# Patient Record
Sex: Female | Born: 1950 | Race: White | Hispanic: No | Marital: Married | Smoking: Never smoker
Health system: Southern US, Community
[De-identification: ages and names within clinical notes are randomized; demographics above are authoritative.]

## PROBLEM LIST (undated history)

## (undated) DIAGNOSIS — E785 Hyperlipidemia, unspecified: Secondary | ICD-10-CM

## (undated) DIAGNOSIS — Z8619 Personal history of other infectious and parasitic diseases: Secondary | ICD-10-CM

## (undated) HISTORY — DX: Hyperlipidemia, unspecified: E78.5

## (undated) HISTORY — DX: Personal history of other infectious and parasitic diseases: Z86.19

---

## 2017-06-09 ENCOUNTER — Encounter: Payer: Self-pay | Admitting: Sports Medicine

## 2017-06-09 ENCOUNTER — Ambulatory Visit: Payer: Self-pay | Admitting: Sports Medicine

## 2017-06-09 ENCOUNTER — Ambulatory Visit (INDEPENDENT_AMBULATORY_CARE_PROVIDER_SITE_OTHER): Payer: Medicare Other | Admitting: Sports Medicine

## 2017-06-09 VITALS — BP 100/66 | HR 76 | Ht 66.5 in | Wt 110.0 lb

## 2017-06-09 DIAGNOSIS — M25532 Pain in left wrist: Secondary | ICD-10-CM | POA: Diagnosis not present

## 2017-06-09 MED ORDER — DICLOFENAC SODIUM 2 % TD SOLN: 1.0000 "application " | Freq: Two times a day (BID) | TRANSDERMAL | 0 refills | Status: AC

## 2017-06-09 NOTE — Progress Notes (Signed)
Heather Ponce. Heather [facility]once [facility] at [facility] [phone]  Tsering Leaman - 67 y.o. female MRN [id number]  Date of birth: [birth year]  Visit Date: 06/09/2017  PCP: Bing Matter, MD   Referred by: No ref. provider found   Scribe for today's visit: Christoper Fabian, ATC     SUBJECTIVE:  Lashaya Kienitz is here for New Patient (Initial Visit) (L wrist pain) .   Her L wrist pain symptoms INITIALLY: Began yesterday 06/08/17 at 3 pm when she slipped and fell on a wet, hardwood floor. Described as 6//10 sharp pain at it's worst but 0/10 at rest and nonradiating Worsened with lifting and general movement Improved with Aleve and ice Additional associated symptoms include: no radiating pain or N/T into the L arm/hand    At this time symptoms are improving compared to onset decreased pain noted She has been taking Aleve and using ice   ROS Denies night time disturbances. Denies fevers, chills, or night sweats. Denies unexplained weight loss. Denies personal history of cancer. Denies changes in bowel or bladder habits. Reports recent unreported falls. Denies new or worsening dyspnea or wheezing. Denies headaches or dizziness.  Denies numbness, tingling or weakness  In the extremities.  Denies dizziness or presyncopal episodes Denies lower extremity edema     HISTORY & PERTINENT PRIOR DATA:  Prior History reviewed and updated per electronic medical record.  Significant history, findings, studies and interim changes include:  reports that  has never smoked. she has never used smokeless tobacco. No results for input(s): HGBA1C, LABURIC, CREATINE in the last 8760 hours. No specialty comments available. No problems updated.  OBJECTIVE:  VS:  HT:5' 6.5" (168.9 cm)   WT:110 lb (49.9 kg)  BMI:17.49    BP:100/66  HR:76bpm  TEMP: ( )  RESP:97 %   PHYSICAL EXAM: Constitutional: WDWN, Non-toxic appearing. Psychiatric: Alert & appropriately  interactive.  Not depressed or anxious appearing. Respiratory: No increased work of breathing.  Trachea Midline Eyes: Pupils are equal.  EOM intact without nystagmus.  No scleral icterus  EXTREMITIES EXAM: No clubbing or cyanosis appreciated Capillary Refill is normal, less than 2 seconds No significant venous stasis changes Pre-tibial edema: No significant pretibial edema Pedal Pulses: Normal & symmetrically palpable  Sensation: Intact to light touch in all UE dermatomes Strength: Normal in bilateral UE myotomes   Left wrist overall well aligned.  No significant deformity.  She has some pain with palpation of the [facility] joint but this is mild.  No pain with axial loading and minimal pain with circumduction.  Ligamentously she is stable does have some bossing over the [facility] joint.  No significant pain with Lourena Simmonds testing.  Grip strength is intact.  She has normal thumb opposition.  No additional findings.   ASSESSMENT & PLAN:   1. Arthralgia of left wrist    PLAN: Symptoms are consistent with [facility] arthritis.  She is significantly improved compared to when this initially occurred and has rapidly been improving since the onset of symptoms.  We will have her begin with topical anti-inflammatories and offered a thumb spica splint however this was declined.  She will follow-up if any lack of improvement or worsening symptoms.  If worsening symptoms further diagnostic evaluation with x-ray will be indicated at that time.  No problem-specific Assessment & Plan notes found for this encounter.   ++++++++++++++++++++++++++++++++++++++++++++ Orders & Meds: No orders of the defined types were placed in this encounter.   Meds ordered this  encounter  Medications  . Diclofenac Sodium (PENNSAID) 2 % SOLN    Sig: Place 1 application onto the skin 2 (two) times daily for 1 day.    Dispense:  8 g    Refill:  0    ++++++++++++++++++++++++++++++++++++++++++++ Follow-up: Return if symptoms worsen or  fail to improve.   Pertinent documentation may be included in additional procedure notes, imaging studies, problem based documentation and patient instructions. Please see these sections of the encounter for additional information regarding this visit. CMA/ATC served as Neurosurgeonscribe during this visit. History, Physical, and Plan performed by medical provider. Documentation and orders reviewed and attested to.      Andrena MewsMichael D Rigby, DO    [facility]

## 2017-07-09 ENCOUNTER — Telehealth: Payer: Medicare Other | Admitting: Nurse Practitioner

## 2017-07-09 DIAGNOSIS — Z20828 Contact with and (suspected) exposure to other viral communicable diseases: Secondary | ICD-10-CM

## 2017-07-09 MED ORDER — OSELTAMIVIR PHOSPHATE 75 MG PO CAPS: 75.0000 mg | ORAL_CAPSULE | Freq: Every day | ORAL | 0 refills | Status: DC

## 2017-07-09 NOTE — Progress Notes (Signed)

## 2017-07-28 ENCOUNTER — Ambulatory Visit: Payer: Medicare Other | Admitting: Family Medicine

## 2017-07-28 ENCOUNTER — Ambulatory Visit (INDEPENDENT_AMBULATORY_CARE_PROVIDER_SITE_OTHER): Payer: Medicare Other | Admitting: Physician Assistant

## 2017-07-28 ENCOUNTER — Encounter: Payer: Self-pay | Admitting: Physician Assistant

## 2017-07-28 VITALS — BP 110/72 | HR 92 | Temp 98.0°F | Ht 65.25 in | Wt 109.5 lb

## 2017-07-28 DIAGNOSIS — Z7689 Persons encountering health services in other specified circumstances: Secondary | ICD-10-CM | POA: Diagnosis not present

## 2017-07-28 DIAGNOSIS — J0100 Acute maxillary sinusitis, unspecified: Secondary | ICD-10-CM

## 2017-07-28 MED ORDER — AMOXICILLIN-POT CLAVULANATE 875-125 MG PO TABS: 1.0000 | ORAL_TABLET | Freq: Two times a day (BID) | ORAL | 0 refills | Status: DC

## 2017-07-28 NOTE — Patient Instructions (Signed)
It was great to see you!  Use medication as prescribed: augmentin  Push fluids and get plenty of rest. Please return if you are not improving as expected, or if you have high fevers (>101.5) or difficulty swallowing or worsening productive cough.  Call clinic with questions.  I hope you start feeling better soon!  

## 2017-07-28 NOTE — Progress Notes (Signed)
Heather Ponce is a 67 y.o. female here for a new problem and to establish care.  I acted as a Neurosurgeon for [employer], PA-C Corky Mull, LPN  History of Present Illness:   Chief Complaint  Patient presents with  . Establish Care    [facility] Medicare  . Sinus Problem    Acute Concerns: Sinusitus -- She has nasal congestion with yellow and brown drainage. Has a slight cough. No fever, chills. Nasal pain under her eyes. Today is day 4 of her symptoms. Up to date on vaccines. Gets sinus infections about twice a year, uses amoxicillin or augmentin to clear it up.  Has been using Mucinex and Flonase, Neti Pot. Does have throat pain. She is starting to lose her voice.   Health Maintenance: Immunizations -- up to date Weight -- Weight: 109 lb 8 oz (49.7 kg)   No flowsheet data found.  No flowsheet data found.   Other providers/specialists: Dentist -- Dr. Wardell Heath  Past Medical History:  Diagnosis Date  . History of chicken pox   . Hyperlipidemia      Social History   Socioeconomic History  . Marital status: Married    Spouse name: Not on file  . Number of children: Not on file  . Years of education: Not on file  . Highest education level: Not on file  Social Needs  . Financial resource strain: Not on file  . Food insecurity - worry: Not on file  . Food insecurity - inability: Not on file  . Transportation needs - medical: Not on file  . Transportation needs - non-medical: Not on file  Occupational History  . Not on file  Tobacco Use  . Smoking status: Never Smoker  . Smokeless tobacco: Never Used  Substance and Sexual Activity  . Alcohol use: No    Frequency: Never  . Drug use: No  . Sexual activity: Yes  Other Topics Concern  . 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 VAGINAL DELIVERY  03/17/1979    Family History  Problem Relation Age of Onset  . Heart disease Mother   . High Cholesterol Mother   . Heart disease Father   . Colon cancer Neg Hx   . Breast cancer Neg Hx     No Known Allergies   Current Medications:   Current Outpatient Medications:  .  rosuvastatin (CRESTOR) 5 MG tablet, Take 5 mg by mouth daily., Disp: , Rfl:  .  amoxicillin-clavulanate (AUGMENTIN) 875-125 MG tablet, Take 1 tablet by mouth 2 (two) times daily., Disp: 20 tablet, Rfl: 0   Review of Systems:   Review of Systems  Constitutional: Negative for chills and fever.  HENT: Positive for congestion, ear pain and sinus pain. Negative for ear discharge.        Started on Monday facial pain, nasal congestion yellow brown nasal discharge. Scratchy throat, laryngitis.  Respiratory: Positive for cough.        Dry hacking cough.  Neurological: Negative for dizziness and headaches.    Vitals:   Vitals:   07/28/17 1111  BP: 110/72  Pulse: 92  Temp: 98 F (36.7 C)  TempSrc: Oral  SpO2: 98%  Weight: 109 lb 8 oz (49.7 kg)  Height: 5' 5.25" (1.657 m)     Body mass index is 18.08 kg/m.  Physical Exam:   Physical Exam  Constitutional: She appears well-developed. She is cooperative.  Non-toxic appearance. She does not have a sickly appearance. She does not appear ill. No distress.  HENT:  Head: Normocephalic and atraumatic.  Right Ear: Tympanic membrane, external ear and ear canal normal. Tympanic membrane is not erythematous, not retracted and not bulging.  Left Ear: Tympanic membrane, external ear and ear canal normal. Tympanic membrane is not erythematous, not retracted and not bulging.  Nose: Mucosal edema and rhinorrhea present. Right sinus exhibits maxillary sinus tenderness. Right sinus exhibits no frontal sinus tenderness. Left sinus exhibits maxillary sinus tenderness. Left sinus exhibits no frontal sinus tenderness.   Mouth/Throat: Uvula is midline and mucous membranes are normal. Posterior oropharyngeal erythema present. No posterior oropharyngeal edema. Tonsils are 1+ on the right. Tonsils are 1+ on the left. No tonsillar exudate.  Eyes: Conjunctivae and lids are normal.  Neck: Trachea normal.  Cardiovascular: Normal rate, regular rhythm, S1 normal, S2 normal and normal heart sounds.  Pulmonary/Chest: Effort normal and breath sounds normal. She has no decreased breath sounds. She has no wheezes. She has no rhonchi. She has no rales.  Lymphadenopathy:    She has no cervical adenopathy.  Neurological: She is alert.  Skin: Skin is warm, dry and intact.  Psychiatric: She has a normal mood and affect. Her speech is normal and behavior is normal.  Nursing note and vitals reviewed.   Assessment and Plan:    Darl PikesSusan was seen today for establish care and sinus problem.  Diagnoses and all orders for this visit:  Acute maxillary sinusitis, recurrence not specified No red flags on exam.  Will initiate Augmentin per orders. Discussed taking medications as prescribed. Reviewed return precautions including worsening fever, SOB, worsening cough or other concerns. Push fluids and rest. I recommend that patient follow-up if symptoms worsen or persist despite treatment x 7-10 days, sooner if needed.  Encounter to establish care  Other orders -     amoxicillin-clavulanate (AUGMENTIN) 875-125 MG tablet; Take 1 tablet by mouth 2 (two) times daily.   . Reviewed expectations re: course of current medical issues. . Discussed self-management of symptoms. . Outlined signs and symptoms indicating need for more acute intervention. . Patient verbalized understanding and all questions were answered. . See orders for this visit as documented in the electronic medical record. . Patient received an After-Visit Summary.   CMA or LPN served as scribe during this visit. History, Physical, and Plan performed by medical provider.  Documentation and orders reviewed and attested to.   Jarold MottoSamantha Navika Hoopes, PA-C

## 2017-10-06 ENCOUNTER — Encounter: Payer: Self-pay | Admitting: Physician Assistant

## 2017-10-06 ENCOUNTER — Ambulatory Visit: Payer: Medicare Other | Admitting: Physician Assistant

## 2017-10-06 VITALS — BP 106/64 | HR 99 | Temp 97.8°F | Ht 65.25 in | Wt 105.0 lb

## 2017-10-06 DIAGNOSIS — J0101 Acute recurrent maxillary sinusitis: Secondary | ICD-10-CM

## 2017-10-06 MED ORDER — AMOXICILLIN-POT CLAVULANATE 875-125 MG PO TABS: 1.0000 | ORAL_TABLET | Freq: Two times a day (BID) | ORAL | 0 refills | Status: DC

## 2017-10-06 NOTE — Progress Notes (Signed)
Heather Ponce is a 67 y.o. female here for a new problem.  I acted as a Neurosurgeon for [employer], PA-C Corky Mull, LPN  History of Present Illness:   Chief Complaint  Patient presents with  . Sinus Problem    Sinus Problem  This is a new problem. Episode onset: Started on Monday. The problem has been gradually worsening since onset. There has been no fever. Her pain is at a severity of 10/10. The pain is severe. Associated symptoms include congestion (Nasal, yellow drainage), headaches, a hoarse voice, sinus pressure and a sore throat (from post nasal drip). Pertinent negatives include no chills or ear pain. Past treatments include spray decongestants (Mucinex and Zyrtec). The treatment provided no relief.    Past Medical History:  Diagnosis Date  . History of chicken pox   . Hyperlipidemia      Social History   Socioeconomic History  . Marital status: Married    Spouse name: Not on file  . Number of children: Not on file  . Years of education: Not on file  . Highest education level: Not on file  Occupational History  . Not on file  Social Needs  . Financial resource strain: Not on file  . Food insecurity:    Worry: Not on file    Inability: Not on file  . Transportation needs:    Medical: Not on file    Non-medical: Not on file  Tobacco Use  . Smoking status: Never Smoker  . Smokeless tobacco: Never Used  Substance and Sexual Activity  . Alcohol use: No    Frequency: Never  . Drug use: No  . Sexual activity: Yes  Lifestyle  . Physical activity:    Days per week: Not on file    Minutes per session: Not on file  . Stress: Not on file  Relationships  . Social connections:    Talks on phone: Not on file    Gets together: Not on file    Attends religious service: Not on file    Active member of club or organization: Not on file    Attends meetings of clubs or organizations: Not on file    Relationship status: Not on file  . Intimate partner violence:   Fear of current or ex partner: Not on file    Emotionally abused: Not on file    Physically abused: Not on file    Forced sexual activity: Not on file  Other Topics Concern  . 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 VAGINAL DELIVERY  03/17/1979    Family History  Problem Relation Age of Onset  . Heart disease Mother   . High Cholesterol Mother   . Heart disease Father   . Colon cancer Neg Hx   . Breast cancer Neg Hx     No Known Allergies  Current Medications:   Current Outpatient Medications:  .  amoxicillin-clavulanate (AUGMENTIN) 875-125 MG tablet, Take 1 tablet by mouth 2 (two) times daily., Disp: 20 tablet, Rfl: 0 .  fluticasone (FLONASE) 50 MCG/ACT nasal spray, fluticasone propionate 50 mcg/actuation nasal spray,suspension, Disp: , Rfl:  .  rosuvastatin (CRESTOR) 5 MG tablet, Take 5 mg by mouth daily., Disp: , Rfl:  Review of Systems:   Review of Systems  Constitutional: Negative for chills.  HENT: Positive for congestion (Nasal, yellow drainage), hoarse voice, sinus pressure and sore throat (from post nasal drip). Negative for ear pain.   Neurological: Positive for headaches.    Vitals:   Vitals:   10/06/17 0930  BP: 106/64  Pulse: 99  Temp: 97.8 F (36.6 C)  TempSrc: Oral  SpO2: 97%  Weight: 105 lb (47.6 kg)  Height: 5' 5.25" (1.657 m)     Body mass index is 17.34 kg/m.  Physical Exam:   Physical Exam  Constitutional: She appears well-developed. She is cooperative.  Non-toxic appearance. She does not have a sickly appearance. She does not appear ill. No distress.  HENT:  Head: Normocephalic and atraumatic.  Right Ear: Tympanic membrane, external ear and ear canal normal. Tympanic membrane is not erythematous, not retracted and  not bulging.  Left Ear: Tympanic membrane, external ear and ear canal normal. Tympanic membrane is not erythematous, not retracted and not bulging.  Nose: Mucosal edema and rhinorrhea present. Right sinus exhibits maxillary sinus tenderness. Right sinus exhibits no frontal sinus tenderness. Left sinus exhibits maxillary sinus tenderness. Left sinus exhibits no frontal sinus tenderness.  Mouth/Throat: Uvula is midline and mucous membranes are normal. Posterior oropharyngeal erythema present. No posterior oropharyngeal edema. Tonsils are 0 on the right. Tonsils are 0 on the left.  Eyes: Conjunctivae and lids are normal.  Neck: Trachea normal.  Cardiovascular: Normal rate, regular rhythm, S1 normal, S2 normal and normal heart sounds.  Pulmonary/Chest: Effort normal and breath sounds normal. She has no decreased breath sounds. She has no wheezes. She has no rhonchi. She has no rales.  Lymphadenopathy:    She has no cervical adenopathy.  Neurological: She is alert.  Skin: Skin is warm, dry and intact.  Psychiatric: She has a normal mood and affect. Her speech is normal and behavior is normal.  Nursing note and vitals reviewed.   Assessment and Plan:    [name] was seen today for sinus problem.  Diagnoses and all orders for this visit:  Acute recurrent maxillary sinusitis  Other orders -     amoxicillin-clavulanate (AUGMENTIN) 875-125 MG tablet; Take 1 tablet by mouth 2 (two) times daily.   No red flags on exam.  Will initiate Augmentin per orders. Discussed taking medications as prescribed. Reviewed return precautions including worsening fever, SOB, worsening cough or other concerns. Push fluids and rest. I recommend that patient follow-up if symptoms worsen or persist despite treatment x 7-10 days, sooner if needed.   . Reviewed expectations re: course of current medical issues. . Discussed self-management of symptoms. . Outlined signs and symptoms indicating need for more acute  intervention. . Patient verbalized understanding and all questions were answered. . See orders for this visit as documented in the electronic medical record. . Patient received an After-Visit Summary.  CMA or LPN served as scribe during this visit. History, Physical, and Plan performed by medical provider. Documentation and orders reviewed and attested to.  Jarold Motto, PA-C

## 2017-10-06 NOTE — Patient Instructions (Signed)
It was great to see you!  Use medication as prescribed: Augmentin Push fluids and get plenty of rest. Please return if you are not improving as expected, or if you have high fevers (>101.5) or difficulty swallowing or worsening productive cough.  Call clinic with questions.  I hope you start feeling better soon!  

## 2018-09-27 ENCOUNTER — Ambulatory Visit: Payer: Medicare Other | Admitting: Physician Assistant

## 2019-04-04 ENCOUNTER — Other Ambulatory Visit: Payer: Self-pay | Admitting: Nurse Practitioner

## 2019-04-04 ENCOUNTER — Other Ambulatory Visit: Payer: Self-pay | Admitting: Neurology

## 2019-04-04 DIAGNOSIS — N644 Mastodynia: Secondary | ICD-10-CM

## 2019-04-06 ENCOUNTER — Ambulatory Visit
Admission: RE | Admit: 2019-04-06 | Discharge: 2019-04-06 | Disposition: A | Discharge status: Home / Self Care | Payer: Medicare Other | Source: Ambulatory Visit | Attending: Nurse Practitioner | Admitting: Nurse Practitioner

## 2019-04-06 ENCOUNTER — Other Ambulatory Visit: Payer: Self-pay

## 2019-04-06 DIAGNOSIS — N644 Mastodynia: Secondary | ICD-10-CM

## 2019-04-06 IMAGING — MR MR BREAST BILAT WO/W CM
8 of 12 series · 33 of 48 positions shown · IV contrast (4 ML GADAVIST)
Comparison: Previous exam(s).

CLINICAL DATA: 68-year-old female complaining of flattening of the
left nipple and burning sensation within the left breast. Patient
had a recent outside diagnostic evaluation in [DATE] which was
unremarkable.

LABS:  Creatinine was obtained on site at [HOSPITAL] at [REDACTED] [HOSPITAL].
Results: Creatinine 0.8 mg/dL.
EXAM:
BILATERAL BREAST MRI WITH AND WITHOUT CONTRAST
TECHNIQUE: Multiplanar, multisequence MR images of both breasts were obtained
prior to and following the intravenous administration of 4 ml of
Gadavist.

[Series 2: t2_tirm_tra ipat (a-p) · axial · 3.0mm · 0.64mm/px · 1 of 55 slices shown]
[im 1/55]
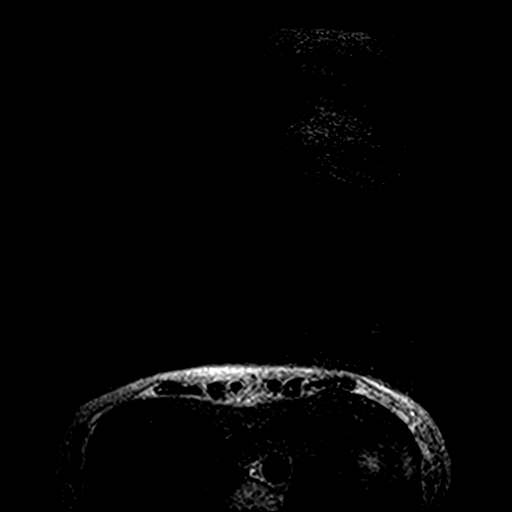

[Series 3: fl3d pre-cm no · axial · non-contrast · 1.2mm · 0.86mm/px · z∈[-83,+88]mm · 5 of 144 slices shown]
[im 1/144]
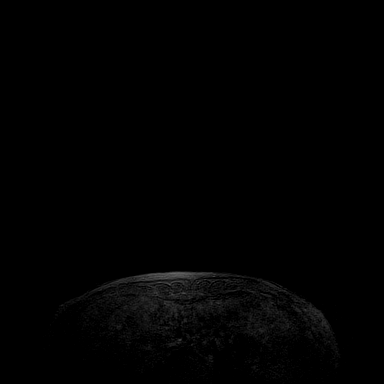
[im 36/144]
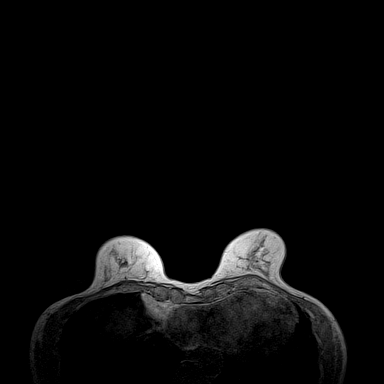
[im 72/144]
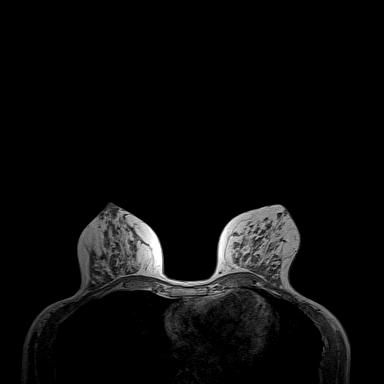
[im 108/144]
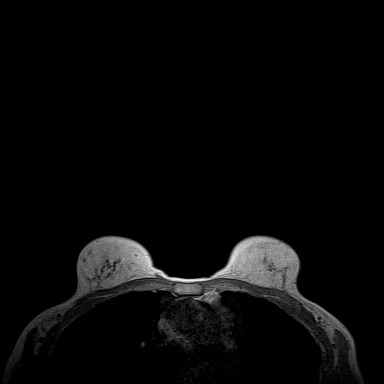
[im 144/144]
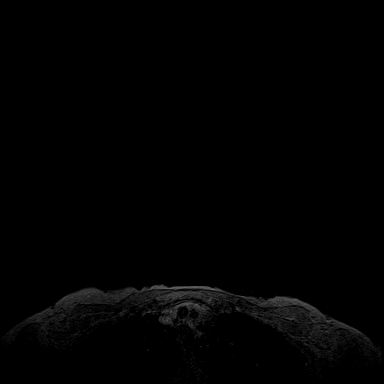

[Series 4: fl3d pre-cm · axial · non-contrast · 1.2mm · 0.86mm/px · z∈[-83,+88]mm · 5 of 144 slices shown]
[im 1/144]
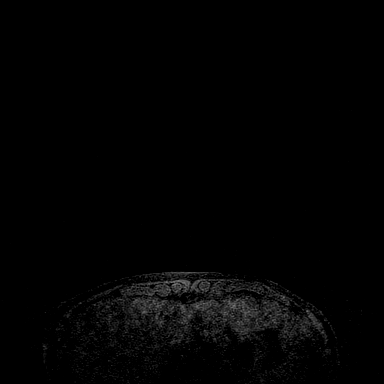
[im 36/144]
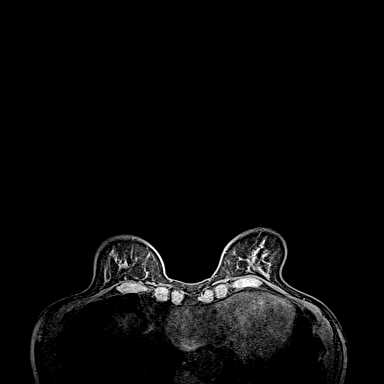
[im 72/144]
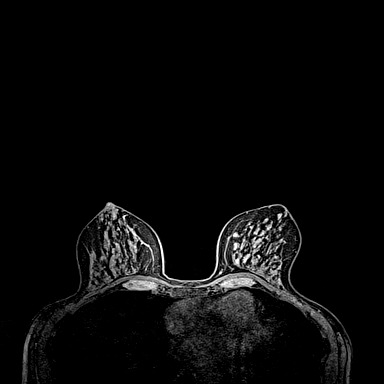
[im 108/144]
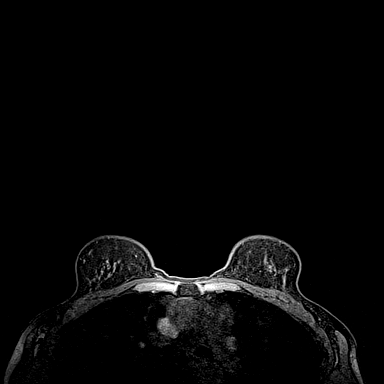
[im 144/144]
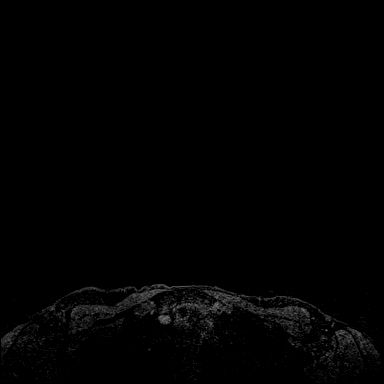

[Series 5: fl3d post-cm 20 · axial · 1.2mm · 0.86mm/px · z∈[-83,+88]mm · 5 of 144 slices shown (1 of 3)]
[im 1/144]
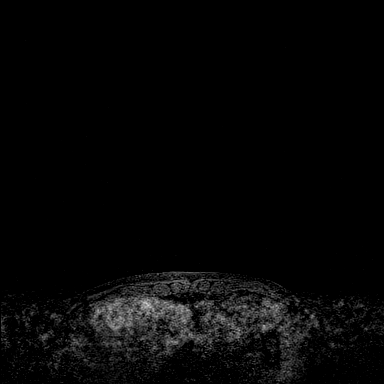
[im 36/144]
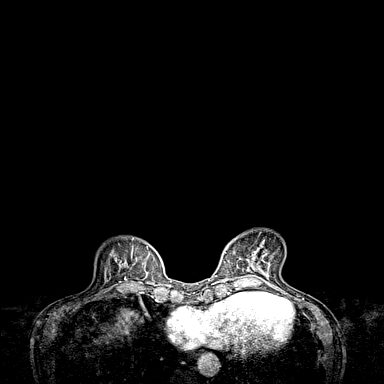
[im 72/144]
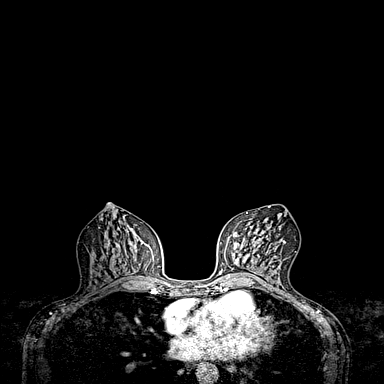
[im 108/144]
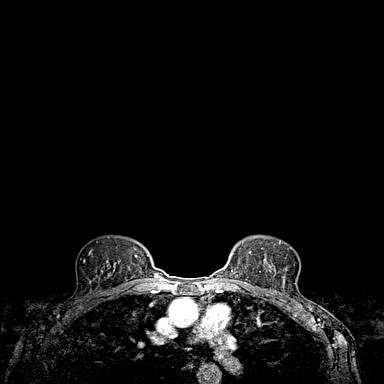
[im 144/144]
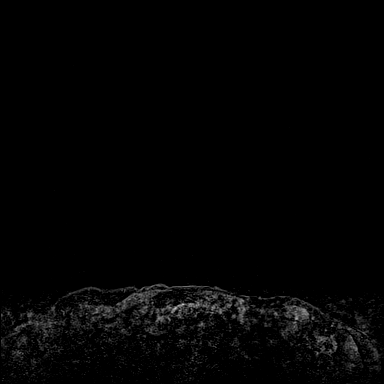

[Series 6: fl3d post-cm 20 · axial · 1.2mm · 0.86mm/px · z∈[-83,+88]mm · 5 of 144 slices shown (2 of 3)]
[im 1/144]
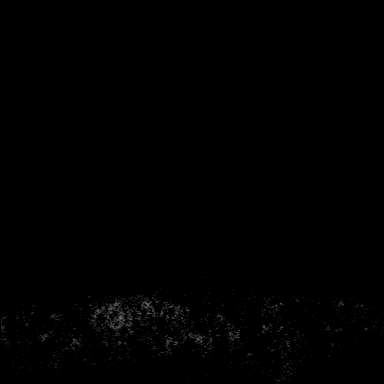
[im 36/144]
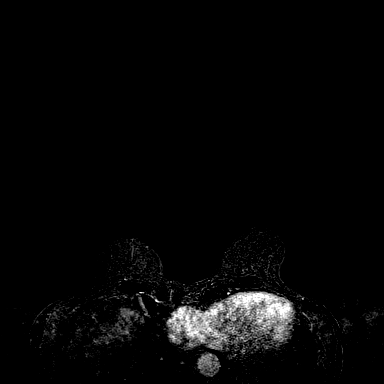
[im 72/144]
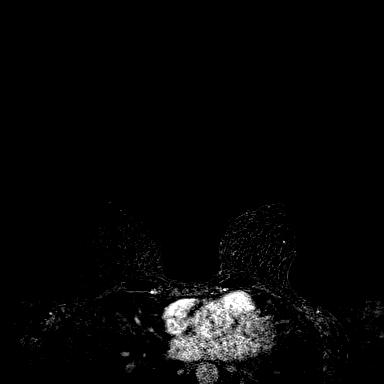
[im 108/144]
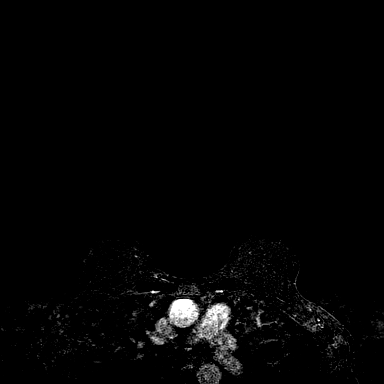
[im 144/144]
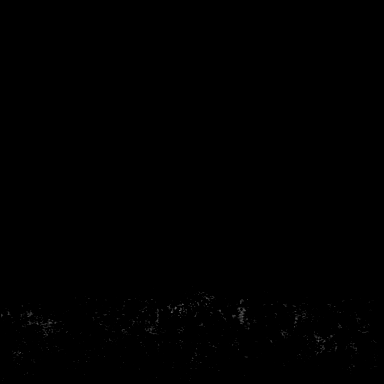

[Series 7: fl3d post-cm 20 · axial · 172.8mm · 0.86mm/px · 1 of 1 slices shown (3 of 3)]
[im 1/1]
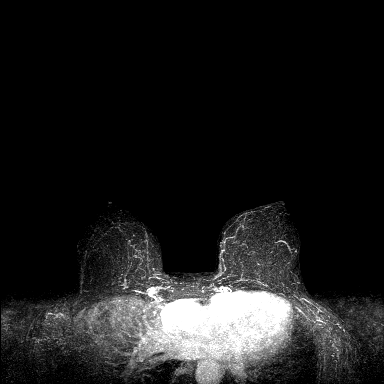

[Series 8: fl3d post-cm 3min · axial · 1.2mm · 0.86mm/px · z∈[-83,+88]mm · 6 of 144 slices shown]
[im 1/144]
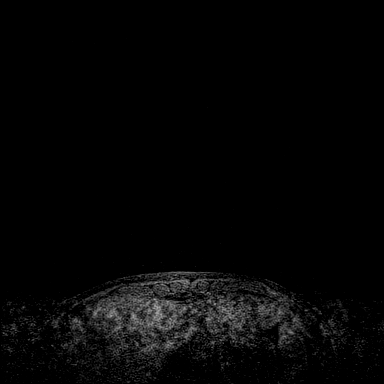
[im 29/144]
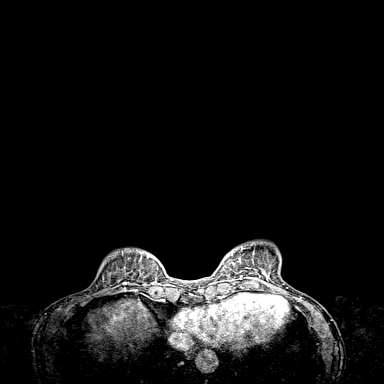
[im 58/144]
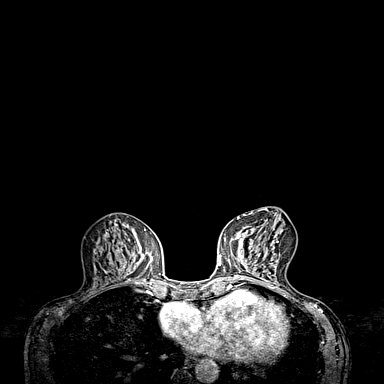
[im 86/144]
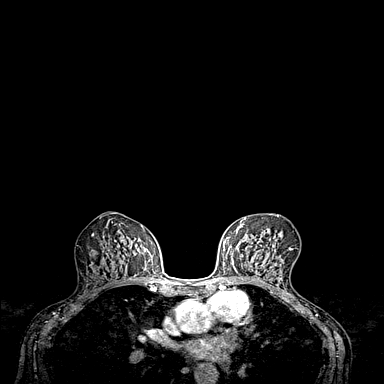
[im 115/144]
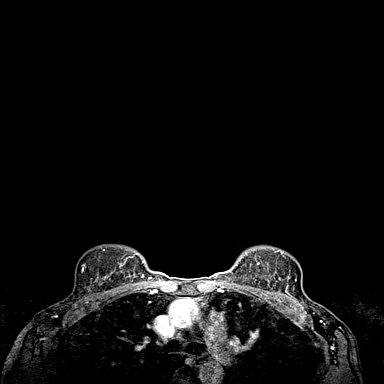
[im 144/144]
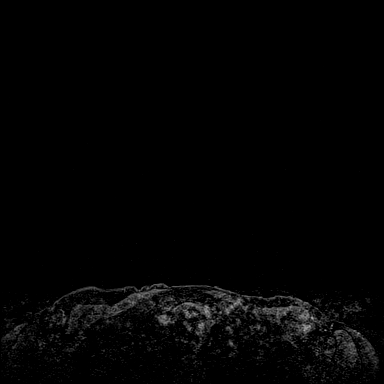

[Series 9: fl3d post-cm 3min_sub · axial · 1.2mm · 0.86mm/px · z∈[-83,+54]mm · 5 of 144 slices shown]
[im 1/144]
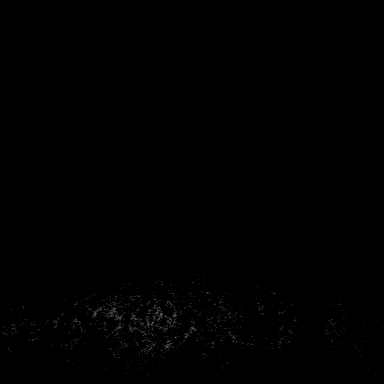
[im 29/144]
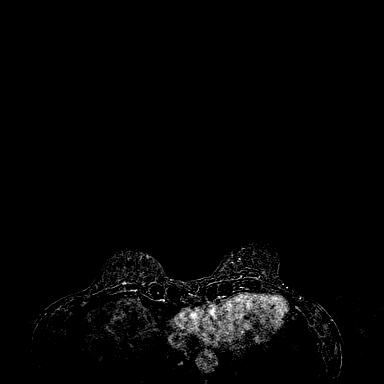
[im 58/144]
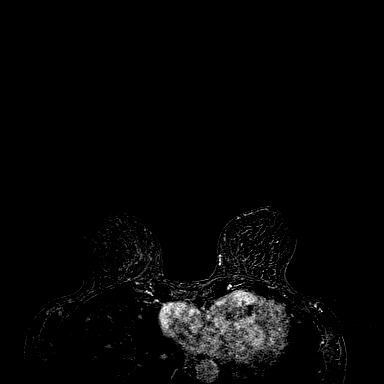
[im 86/144]
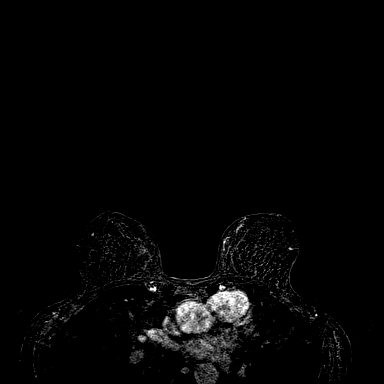
[im 115/144]
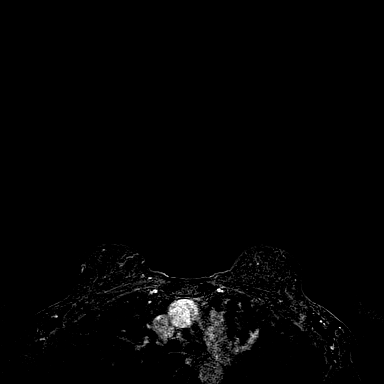

[33 of 48 positions shown; findings below may reference images not displayed]

Three-dimensional MR images were rendered by post-processing of the
original MR data on an independent workstation. The
three-dimensional MR images were interpreted, and findings are
reported in the following complete MRI report for this study. Three
dimensional images were evaluated at the independent DynaCad
workstation
FINDINGS: Breast composition: c. Heterogeneous fibroglandular tissue.

Background parenchymal enhancement: Mild.

Right breast: No mass or abnormal enhancement.

Left breast: No mass or abnormal enhancement.

Lymph nodes: No abnormal appearing lymph nodes.

Ancillary findings:  None.
IMPRESSION: No MRI evidence for malignancy in either breast.

RECOMMENDATION:
Clinical and symptomatic follow-up for the patient's left breast
symptoms.

Patient is due for annual bilateral screening in [DATE].

BI-RADS CATEGORY  1: Negative.

## 2019-04-06 MED ORDER — GADOBUTROL 1 MMOL/ML IV SOLN
4.0000 mL | Freq: Once | INTRAVENOUS | Status: AC | PRN
  Administered 2019-04-06: 4 mL via INTRAVENOUS

## 2019-07-09 ENCOUNTER — Ambulatory Visit: Payer: Medicare Other

## 2019-07-15 ENCOUNTER — Ambulatory Visit: Payer: Medicare Other

## 2020-01-11 ENCOUNTER — Other Ambulatory Visit: Payer: Self-pay | Admitting: Medical

## 2020-01-11 DIAGNOSIS — N644 Mastodynia: Secondary | ICD-10-CM

## 2020-03-25 ENCOUNTER — Ambulatory Visit: Payer: Self-pay

## 2020-10-17 ENCOUNTER — Other Ambulatory Visit: Payer: Self-pay | Admitting: Medical

## 2020-10-17 DIAGNOSIS — N644 Mastodynia: Secondary | ICD-10-CM

## 2020-10-29 ENCOUNTER — Other Ambulatory Visit: Payer: Medicare Other

## 2020-11-20 ENCOUNTER — Other Ambulatory Visit: Payer: Self-pay

## 2020-11-20 ENCOUNTER — Ambulatory Visit
Admission: RE | Admit: 2020-11-20 | Discharge: 2020-11-20 | Disposition: A | Discharge status: Home / Self Care | Payer: Medicare PPO | Source: Ambulatory Visit | Attending: Family Medicine | Admitting: Family Medicine

## 2020-11-20 DIAGNOSIS — N644 Mastodynia: Secondary | ICD-10-CM

## 2020-11-20 IMAGING — MR MR BREAST BILAT WO/W CM
8 of 12 series · 33 of 48 positions shown · IV contrast (5 mL GADAVIST)
Comparison: Previous exam(s).

CLINICAL DATA: 69-year-old female presenting for evaluation of
left-sided breast pain [REDACTED], and states that she pulled muscle
within pushing her grandchild on a swing. She has history of a
benign left breast biopsy twenty years ago.

LABS:  None.
EXAM:
BILATERAL BREAST MRI WITH AND WITHOUT CONTRAST
TECHNIQUE: Multiplanar, multisequence MR images of both breasts were obtained
prior to and following the intravenous administration of 5 ml of
Gadavist

[Series 2: t2_tirm_tra ipat (a-p) · axial · 3.0mm · 0.70mm/px · 1 of 55 slices shown]
[im 1/55]
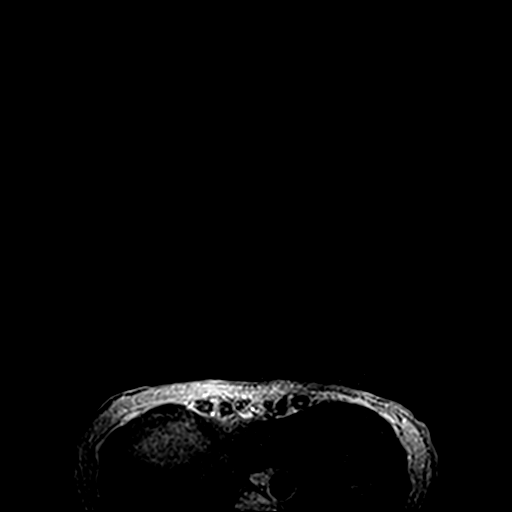

[Series 3: fl3d pre-cm no · axial · non-contrast · 1.2mm · 0.94mm/px · z∈[-90,+82]mm · 5 of 144 slices shown]
[im 1/144]
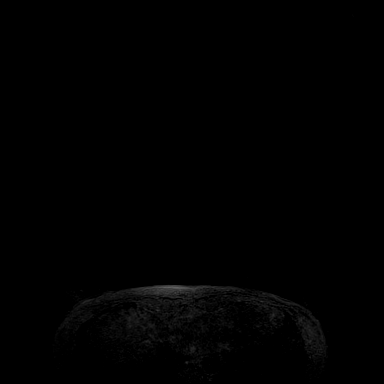
[im 36/144]
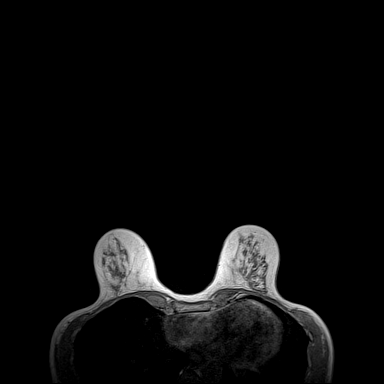
[im 72/144]
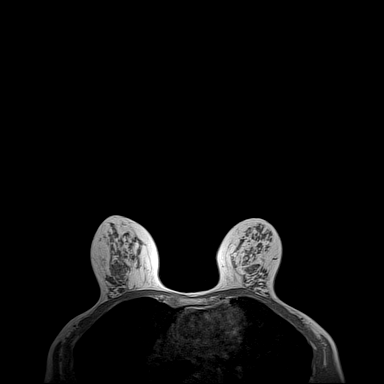
[im 108/144]
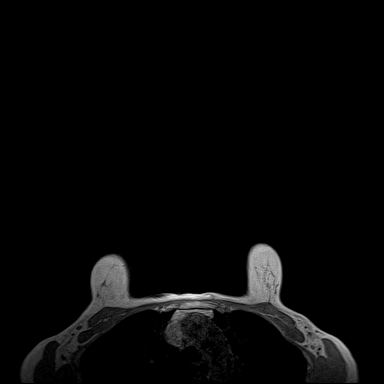
[im 144/144]
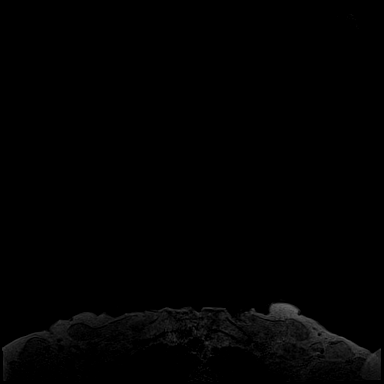

[Series 4: fl3d pre-cm · axial · non-contrast · 1.2mm · 0.94mm/px · z∈[-90,+82]mm · 5 of 144 slices shown]
[im 1/144]
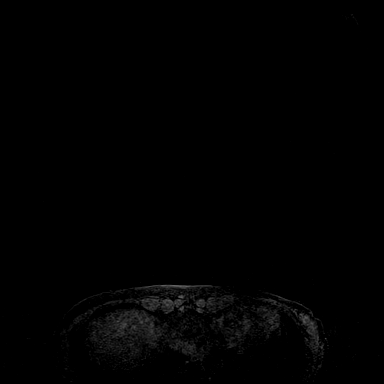
[im 36/144]
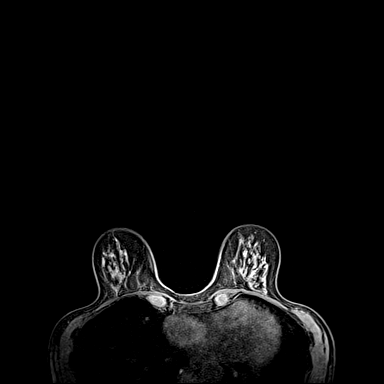
[im 72/144]
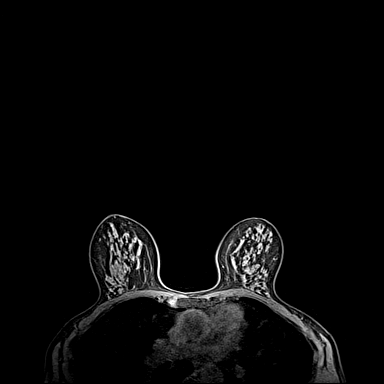
[im 108/144]
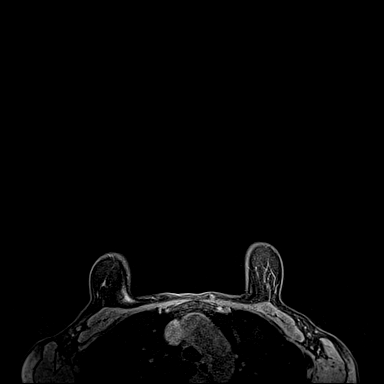
[im 144/144]
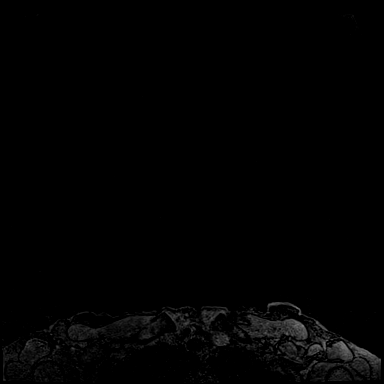

[Series 5: fl3d post immediate · axial · 1.2mm · 0.94mm/px · z∈[-90,+82]mm · 5 of 144 slices shown (1 of 3)]
[im 1/144]
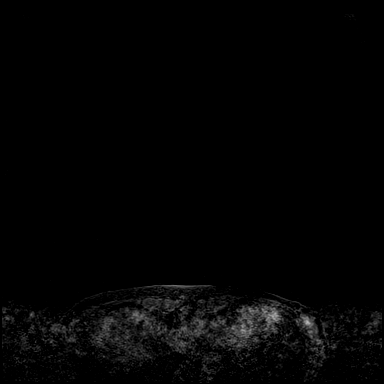
[im 36/144]
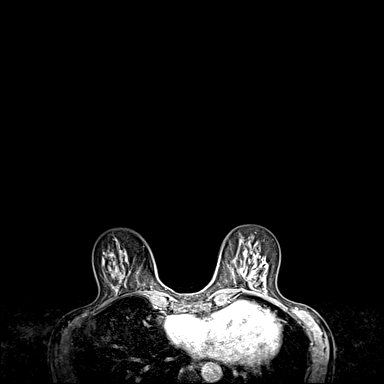
[im 72/144]
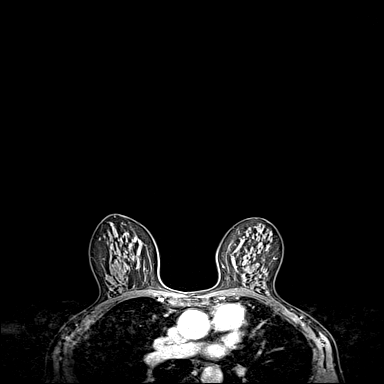
[im 108/144]
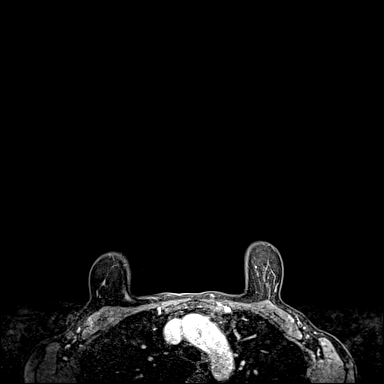
[im 144/144]
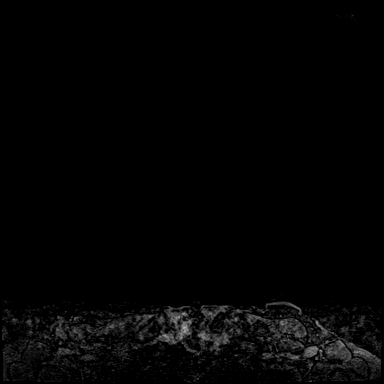

[Series 6: fl3d post immediate · axial · 1.2mm · 0.94mm/px · z∈[-90,+82]mm · 5 of 144 slices shown (2 of 3)]
[im 1/144]
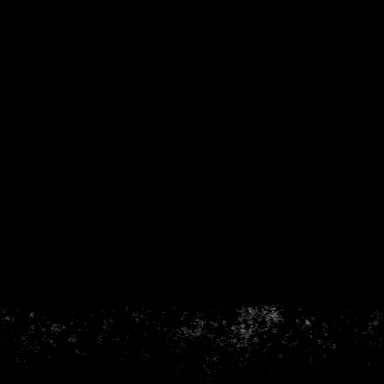
[im 36/144]
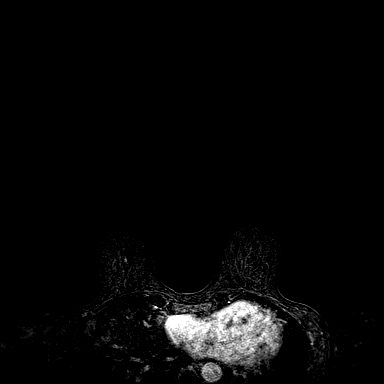
[im 72/144]
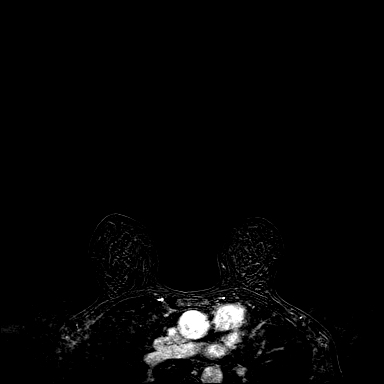
[im 108/144]
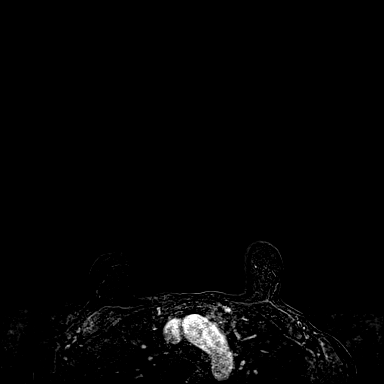
[im 144/144]
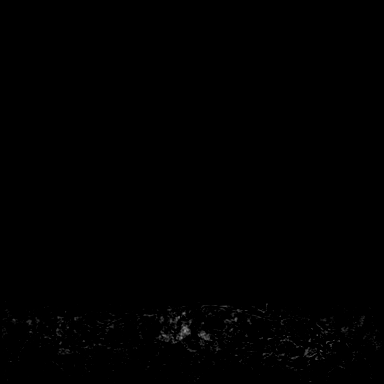

[Series 7: fl3d post immediate · axial · 172.8mm · 0.94mm/px · 1 of 1 slices shown (3 of 3)]
[im 1/1]
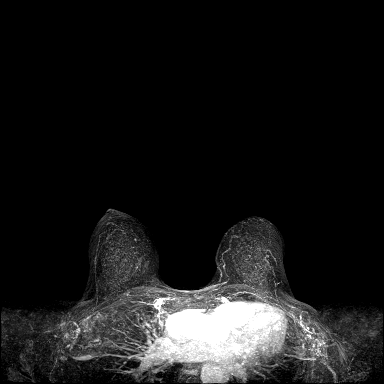

[Series 8: fl3d post 3min · axial · 1.2mm · 0.94mm/px · z∈[-90,+82]mm · 6 of 144 slices shown]
[im 1/144]
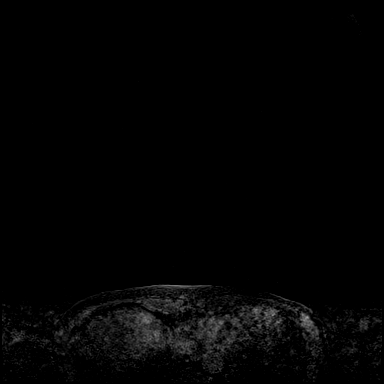
[im 29/144]
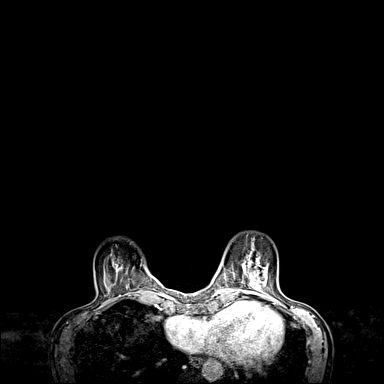
[im 58/144]
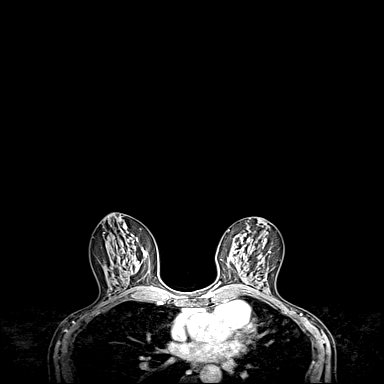
[im 86/144]
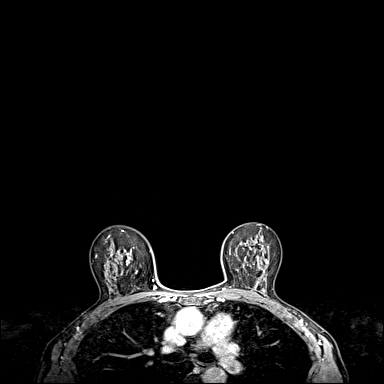
[im 115/144]
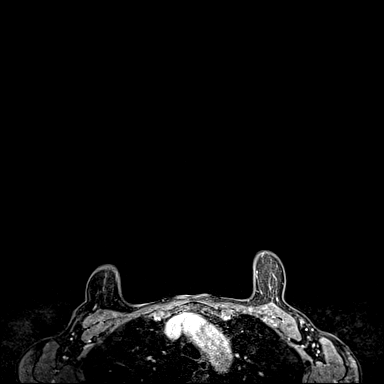
[im 144/144]
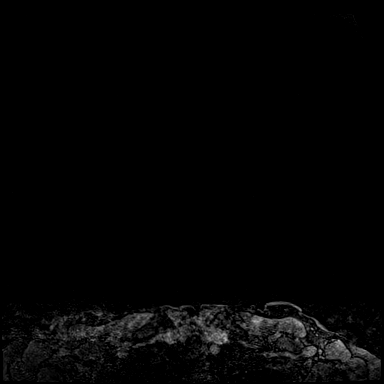

[Series 9: fl3d post 3min_sub · axial · 1.2mm · 0.94mm/px · z∈[-90,+47]mm · 5 of 144 slices shown]
[im 1/144]
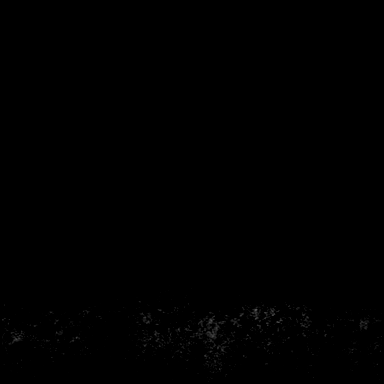
[im 29/144]
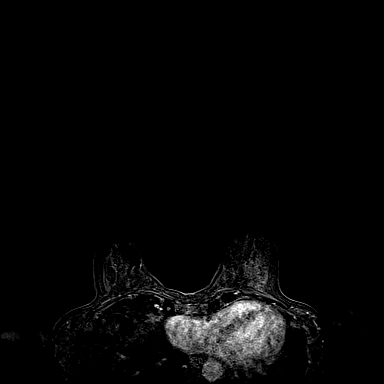
[im 58/144]
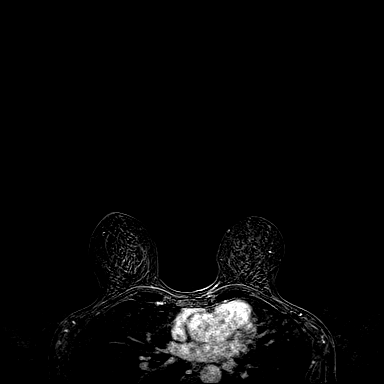
[im 86/144]
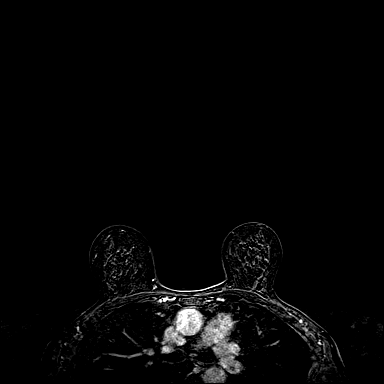
[im 115/144]
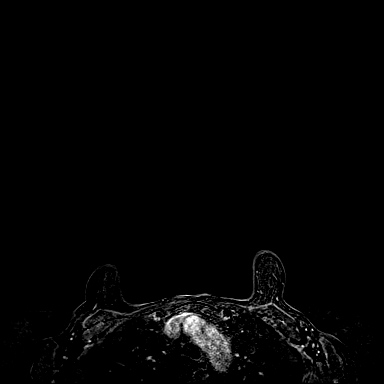

[33 of 48 positions shown; findings below may reference images not displayed]

Three-dimensional MR images were rendered by post-processing of the
original MR data on an independent workstation. The
three-dimensional MR images were interpreted, and findings are
reported in the following complete MRI report for this study. Three
dimensional images were evaluated at the independent interpreting
workstation using the DynaCAD thin client.
FINDINGS: Breast composition: c. Heterogeneous fibroglandular tissue.

Background parenchymal enhancement: Minimal.

Right breast: No mass or abnormal enhancement.

Left breast: No mass or abnormal enhancement.

Lymph nodes: No abnormal appearing lymph nodes.

Ancillary findings:  None.
IMPRESSION: No MRI evidence of malignancy in the bilateral breasts. No findings
to correspond with the patient's left-sided breast pain.

RECOMMENDATION:
1. Clinical follow-up recommended for the left breast pain. Any
further workup should be based on clinical grounds.

2.  Annual screening mammography due in [DATE].

BI-RADS CATEGORY  1: Negative.

## 2020-11-20 MED ORDER — GADOBUTROL 1 MMOL/ML IV SOLN
5.0000 mL | Freq: Once | INTRAVENOUS | Status: AC | PRN
  Administered 2020-11-20: 11:00:00 5 mL via INTRAVENOUS

## 2021-04-19 ENCOUNTER — Telehealth: Payer: Medicare PPO | Admitting: Physician Assistant

## 2021-04-19 DIAGNOSIS — Z20828 Contact with and (suspected) exposure to other viral communicable diseases: Secondary | ICD-10-CM | POA: Diagnosis not present

## 2021-04-19 MED ORDER — OSELTAMIVIR PHOSPHATE 75 MG PO CAPS: 75.0000 mg | ORAL_CAPSULE | Freq: Every day | ORAL | 0 refills | Status: DC

## 2021-04-19 NOTE — Progress Notes (Signed)
E visit for Flu like symptoms   We are sorry that you are not feeling well.  Here is how we plan to help! Based on what you have shared with me it looks like you may have had a flu exposure.  Influenza or "the flu" is   an infection caused by a respiratory virus. The flu virus is highly contagious and persons who did not receive their yearly flu vaccination may "catch" the flu from close contact.  We have anti-viral medications to treat the viruses that cause this infection. They are not a "cure" and only shorten the course of the infection. These prescriptions are most effective when they are given within the first 2 days of "flu" symptoms. Antiviral medication are indicated if you have a high risk of complications from the flu. You should  also consider an antiviral medication if you are in close contact with someone who is at risk. These medications can help patients avoid complications from the flu  but have side effects that you should know. Possible side effects from Tamiflu or oseltamivir include nausea, vomiting, diarrhea, dizziness, headaches, eye redness, sleep problems or other respiratory symptoms. You should not take Tamiflu if you have an allergy to oseltamivir or any to the ingredients in Tamiflu.  Based upon your symptoms and potential risk factors Tamiflu 75mg once daily for 10 days as prophylaxis. If you develop symptoms, start taking twice daily.   ANYONE WHO HAS FLU SYMPTOMS SHOULD: Stay home. The flu is highly contagious and going out or to work exposes others! Be sure to drink plenty of fluids. Water is fine as well as fruit juices, sodas and electrolyte beverages. You may want to stay away from caffeine or alcohol. If you are nauseated, try taking small sips of liquids. How do you know if you are getting enough fluid? Your urine should be a pale yellow or almost colorless. Get rest. Taking a steamy shower or using a humidifier may help nasal congestion and ease sore throat  pain. Using a saline nasal spray works much the same way. Cough drops, hard candies and sore throat lozenges may ease your cough. Line up a caregiver. Have someone check on you regularly.   GET HELP RIGHT AWAY IF: You cannot keep down liquids or your medications. You become short of breath Your fell like you are going to pass out or loose consciousness. Your symptoms persist after you have completed your treatment plan MAKE SURE YOU  Understand these instructions. Will watch your condition. Will get help right away if you are not doing well or get worse.  Your e-visit answers were reviewed by a board certified advanced clinical practitioner to complete your personal care plan.  Depending on the condition, your plan could have included both over the counter or prescription medications.  If there is a problem please reply  once you have received a response from your provider.  Your safety is important to us.  If you have drug allergies check your prescription carefully.    You can use MyChart to ask questions about today's visit, request a non-urgent call back, or ask for a work or school excuse for 24 hours related to this e-Visit. If it has been greater than 24 hours you will need to follow up with your provider, or enter a new e-Visit to address those concerns.  You will get an e-mail in the next two days asking about your experience.  I hope that your e-visit has been valuable and   will speed your recovery. Thank you for using e-visits.   I provided 5 minutes of non face-to-face time during this encounter for chart review and documentation.  

## 2021-09-21 ENCOUNTER — Other Ambulatory Visit (HOSPITAL_BASED_OUTPATIENT_CLINIC_OR_DEPARTMENT_OTHER): Payer: Self-pay

## 2021-09-21 ENCOUNTER — Telehealth: Payer: Medicare PPO | Admitting: Physician Assistant

## 2021-09-21 DIAGNOSIS — B9689 Other specified bacterial agents as the cause of diseases classified elsewhere: Secondary | ICD-10-CM

## 2021-09-21 DIAGNOSIS — J019 Acute sinusitis, unspecified: Secondary | ICD-10-CM

## 2021-09-21 MED ORDER — AMOXICILLIN-POT CLAVULANATE 875-125 MG PO TABS
1.0000 | ORAL_TABLET | Freq: Two times a day (BID) | ORAL | 0 refills | Status: DC
  Filled 2021-09-21: qty 20, 10d supply, fill #0

## 2021-09-21 NOTE — Progress Notes (Signed)

## 2022-03-15 ENCOUNTER — Telehealth: Payer: Medicare PPO | Admitting: Emergency Medicine

## 2022-03-15 ENCOUNTER — Other Ambulatory Visit (HOSPITAL_BASED_OUTPATIENT_CLINIC_OR_DEPARTMENT_OTHER): Payer: Self-pay

## 2022-03-15 DIAGNOSIS — J019 Acute sinusitis, unspecified: Secondary | ICD-10-CM

## 2022-03-15 MED ORDER — AMOXICILLIN-POT CLAVULANATE 875-125 MG PO TABS
1.0000 | ORAL_TABLET | Freq: Two times a day (BID) | ORAL | 0 refills | Status: DC
  Filled 2022-03-15: qty 14, 7d supply, fill #0

## 2022-03-15 NOTE — Progress Notes (Signed)

## 2022-09-06 ENCOUNTER — Telehealth: Payer: Medicare PPO | Admitting: Family Medicine

## 2022-09-06 DIAGNOSIS — J019 Acute sinusitis, unspecified: Secondary | ICD-10-CM

## 2022-09-06 MED ORDER — AMOXICILLIN-POT CLAVULANATE 875-125 MG PO TABS: 1.0000 | ORAL_TABLET | Freq: Two times a day (BID) | ORAL | 0 refills | Status: AC

## 2022-09-06 NOTE — Progress Notes (Signed)

## 2023-01-12 ENCOUNTER — Encounter: Payer: Self-pay | Admitting: Medical

## 2023-01-12 ENCOUNTER — Other Ambulatory Visit: Payer: Self-pay | Admitting: Medical

## 2023-01-12 DIAGNOSIS — N644 Mastodynia: Secondary | ICD-10-CM

## 2023-02-18 ENCOUNTER — Encounter: Payer: Self-pay | Admitting: Medical

## 2023-02-20 ENCOUNTER — Ambulatory Visit
Admission: RE | Admit: 2023-02-20 | Discharge: 2023-02-20 | Disposition: A | Discharge status: Home / Self Care | Payer: Medicare PPO | Source: Ambulatory Visit

## 2023-02-20 DIAGNOSIS — N644 Mastodynia: Secondary | ICD-10-CM

## 2023-02-20 MED ORDER — GADOPICLENOL 0.5 MMOL/ML IV SOLN
5.0000 mL | Freq: Once | INTRAVENOUS | Status: AC | PRN
  Administered 2023-02-20: 5 mL via INTRAVENOUS

## 2023-05-26 ENCOUNTER — Encounter: Payer: Self-pay | Admitting: Family Medicine
